# Patient Record
Sex: Male | Born: 1950 | Hispanic: No | Smoking: Current every day smoker
Health system: Southern US, Community
[De-identification: ages and names within clinical notes are randomized; demographics above are authoritative.]

## PROBLEM LIST (undated history)

## (undated) DIAGNOSIS — I10 Essential (primary) hypertension: Secondary | ICD-10-CM

## (undated) HISTORY — DX: Essential (primary) hypertension: I10

---

## 2010-10-25 ENCOUNTER — Encounter: Payer: Self-pay | Admitting: Emergency Medicine

## 2012-04-16 ENCOUNTER — Other Ambulatory Visit: Payer: Self-pay | Admitting: Emergency Medicine

## 2012-04-16 ENCOUNTER — Telehealth: Payer: Self-pay | Admitting: Physician Assistant

## 2012-04-16 NOTE — Telephone Encounter (Signed)
Patient's rx for metoprolol sent in error. Pharmacy contacted and rx cancelled. Patient needs office visit.

## 2012-04-18 ENCOUNTER — Telehealth: Payer: Self-pay

## 2012-04-18 MED ORDER — METOPROLOL SUCCINATE ER 100 MG PO TB24: 100.0000 mg | ORAL_TABLET | Freq: Every day | ORAL | Status: DC

## 2012-04-18 NOTE — Telephone Encounter (Signed)
ADVISED WIFE THAT MED WAS SENT IN AND THAT HE MUST KEEP APPT IN AUG

## 2012-04-18 NOTE — Telephone Encounter (Signed)
I sent in medication but I do not see an appt for pt in August with Dr. Cleta Alberts so please make sure they understand there will be no more refills without an ov.

## 2012-04-18 NOTE — Telephone Encounter (Signed)
Johnny Calhoun STATES HER BP MEDICINE WAS REFILLED, BUT NOT HER HUSBAND, SHE UNDERSTANDS HE HASN'T BEEN HERE IN A WHILE, THEY ARE WITHOUT INSURANCE AND HAVE MADE AN APPT WITH DR DAUB IN [place]. PLEASE CALL [phone]   [facility] AT [phone]

## 2012-05-15 ENCOUNTER — Encounter: Payer: Self-pay | Admitting: Emergency Medicine

## 2012-05-15 ENCOUNTER — Ambulatory Visit: Payer: Self-pay

## 2012-05-15 ENCOUNTER — Ambulatory Visit: Payer: Self-pay | Admitting: Emergency Medicine

## 2012-05-15 VITALS — BP 207/103 | HR 54 | Temp 97.1°F | Resp 16 | Ht 70.0 in | Wt 171.0 lb

## 2012-05-15 DIAGNOSIS — I10 Essential (primary) hypertension: Secondary | ICD-10-CM

## 2012-05-15 LAB — CBC WITH DIFFERENTIAL/PLATELET
Basophils Absolute: 0.1 10*3/uL (ref 0.0–0.1)
Lymphocytes Relative: 21 % (ref 12–46)
Lymphs Abs: 1.6 10*3/uL (ref 0.7–4.0)
MCV: 91.1 fL (ref 78.0–100.0)
Neutro Abs: 4.9 10*3/uL (ref 1.7–7.7)
Neutrophils Relative %: 65 % (ref 43–77)
Platelets: 271 10*3/uL (ref 150–400)
RBC: 5.14 MIL/uL (ref 4.22–5.81)
RDW: 13.8 % (ref 11.5–15.5)
WBC: 7.5 10*3/uL (ref 4.0–10.5)

## 2012-05-15 LAB — COMPREHENSIVE METABOLIC PANEL
Albumin: 4.4 g/dL (ref 3.5–5.2)
CO2: 29 mEq/L (ref 19–32)
Calcium: 9.7 mg/dL (ref 8.4–10.5)
Chloride: 103 mEq/L (ref 96–112)
Glucose, Bld: 89 mg/dL (ref 70–99)
Sodium: 138 mEq/L (ref 135–145)
Total Bilirubin: 1.5 mg/dL — ABNORMAL HIGH (ref 0.3–1.2)
Total Protein: 7 g/dL (ref 6.0–8.3)

## 2012-05-15 LAB — LIPID PANEL: HDL: 47 mg/dL (ref >39)

## 2012-05-15 MED ORDER — METOPROLOL SUCCINATE ER 100 MG PO TB24: 100.0000 mg | ORAL_TABLET | Freq: Every day | ORAL | Status: DC

## 2012-05-15 MED ORDER — AMLODIPINE BESYLATE 10 MG PO TABS: 10.0000 mg | ORAL_TABLET | Freq: Every day | ORAL | Status: DC

## 2012-05-15 MED ORDER — HYDROCHLOROTHIAZIDE 12.5 MG PO TABS: 12.5000 mg | ORAL_TABLET | Freq: Every day | ORAL | Status: DC

## 2012-05-15 NOTE — Progress Notes (Signed)
  Subjective:    Patient ID: Johnny Calhoun, male    DOB: 1951-04-22, 61 y.o.   MRN: [id number]  HPI patient here to recheck his blood pressure . He has been out of his medication now for about 3 months. 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 He continues to smoke 1/2-2 packs per day. He needs to drink 5 glasses of vodka per day he has not been taking his medications for a few months.    Review of Systems     Objective:   Physical Exam HEENT exam is unremarkable. Chest exam reveals diminished breath sounds in the bases. His cardiac exam is unremarkable  [facility] reading (PRIMARY) by  Dr.Daub NAD      Assessment & Plan:   Labs were done. Blood pressure medications were refilled. Patient was advised of his high risk of stroke or heart attack if he doesn't make some major lifestyle changes and be compliant with his medications

## 2012-09-16 ENCOUNTER — Other Ambulatory Visit: Payer: Self-pay | Admitting: Emergency Medicine

## 2012-12-20 ENCOUNTER — Other Ambulatory Visit: Payer: Self-pay | Admitting: *Deleted

## 2012-12-20 MED ORDER — AMLODIPINE BESYLATE 10 MG PO TABS: ORAL_TABLET | ORAL | Status: DC

## 2013-01-21 ENCOUNTER — Other Ambulatory Visit: Payer: Self-pay | Admitting: Physician Assistant

## 2013-02-18 ENCOUNTER — Other Ambulatory Visit: Payer: Self-pay | Admitting: Emergency Medicine

## 2013-05-18 ENCOUNTER — Telehealth: Payer: Self-pay

## 2013-05-18 DIAGNOSIS — I10 Essential (primary) hypertension: Secondary | ICD-10-CM

## 2013-05-18 MED ORDER — METOPROLOL SUCCINATE ER 100 MG PO TB24: 100.0000 mg | ORAL_TABLET | Freq: Every day | ORAL | Status: DC

## 2013-05-18 MED ORDER — AMLODIPINE BESYLATE 10 MG PO TABS: 10.0000 mg | ORAL_TABLET | Freq: Every day | ORAL | Status: DC

## 2013-05-18 NOTE — Telephone Encounter (Signed)
15 day supply sent.  Needs OV, 3rd notice.  No further refills until OV

## 2013-05-18 NOTE — Telephone Encounter (Signed)
Patient called needing refill on; metoprolol succinate (TOPROL-XL) 100 MG 24 hr tablet And on amLODipine (NORVASC) 10 MG tablet  Best number; [phone]  Pharmacy is; [facility] in [place] phone number is: [phone]

## 2013-05-19 NOTE — Telephone Encounter (Signed)
LMOM 15 day supply sent to pharmacy, RTC for additional refills

## 2013-05-20 ENCOUNTER — Telehealth: Payer: Self-pay

## 2013-05-20 DIAGNOSIS — I10 Essential (primary) hypertension: Secondary | ICD-10-CM

## 2013-05-20 MED ORDER — METOPROLOL SUCCINATE ER 100 MG PO TB24: 100.0000 mg | ORAL_TABLET | Freq: Every day | ORAL | Status: DC

## 2013-05-20 MED ORDER — AMLODIPINE BESYLATE 10 MG PO TABS: 10.0000 mg | ORAL_TABLET | Freq: Every day | ORAL | Status: DC

## 2013-05-20 NOTE — Telephone Encounter (Signed)
Wife called and reported that they just p/up RFs of BP meds and only got #15 d/t 3rd notice needing OV. She has called and scheduled appt after first notice but was a couple of months before she could get an appt. appt scheduled for  9/23 and advised wife that I will send in #15 more to cover him until appt.

## 2013-06-25 ENCOUNTER — Ambulatory Visit: Payer: Self-pay | Admitting: Emergency Medicine

## 2013-06-25 ENCOUNTER — Encounter: Payer: Self-pay | Admitting: Emergency Medicine

## 2013-06-25 VITALS — BP 156/100 | HR 58 | Temp 97.9°F | Resp 16 | Ht 69.5 in | Wt 169.4 lb

## 2013-06-25 DIAGNOSIS — I10 Essential (primary) hypertension: Secondary | ICD-10-CM | POA: Insufficient documentation

## 2013-06-25 DIAGNOSIS — N400 Enlarged prostate without lower urinary tract symptoms: Secondary | ICD-10-CM

## 2013-06-25 DIAGNOSIS — F17200 Nicotine dependence, unspecified, uncomplicated: Secondary | ICD-10-CM

## 2013-06-25 LAB — COMPREHENSIVE METABOLIC PANEL
ALT: 38 U/L (ref 0–53)
BUN: 15 mg/dL (ref 6–23)
CO2: 29 mEq/L (ref 19–32)
Calcium: 9.7 mg/dL (ref 8.4–10.5)
Chloride: 102 mEq/L (ref 96–112)
Creat: 0.9 mg/dL (ref 0.50–1.35)
Total Protein: 7.3 g/dL (ref 6.0–8.3)

## 2013-06-25 LAB — CBC WITH DIFFERENTIAL/PLATELET
Eosinophils Relative: 2 % (ref 0–5)
HCT: 47.7 % (ref 39.0–52.0)
Hemoglobin: 16.9 g/dL (ref 13.0–17.0)
Lymphocytes Relative: 29 % (ref 12–46)
Lymphs Abs: 2 10*3/uL (ref 0.7–4.0)
MCV: 94.5 fL (ref 78.0–100.0)
Monocytes Absolute: 0.6 10*3/uL (ref 0.1–1.0)
Neutro Abs: 4 10*3/uL (ref 1.7–7.7)
Platelets: 277 10*3/uL (ref 150–400)
RBC: 5.05 MIL/uL (ref 4.22–5.81)
WBC: 6.8 10*3/uL (ref 4.0–10.5)

## 2013-06-25 MED ORDER — AMLODIPINE BESYLATE 10 MG PO TABS: 10.0000 mg | ORAL_TABLET | Freq: Every day | ORAL | Status: DC

## 2013-06-25 MED ORDER — METOPROLOL SUCCINATE ER 100 MG PO TB24: 100.0000 mg | ORAL_TABLET | Freq: Every day | ORAL | Status: DC

## 2013-06-25 NOTE — Progress Notes (Signed)
  Subjective:    Patient ID: Johnny Calhoun, male    DOB: Feb 11, 1951, 62 y.o.   MRN: [id number]  HPI followup hypertension. He continues to smoke a pack and a half a day. He continues to drink regularly. He denies any chest pain or shortness of breath. He states he has been only taking a half a pill because he's been running out of his medications . He intends to get his flu shot at the pharmacy. He recently has had sinus congestion which is getting better the last couple days.    Review of Systems     Objective:   Physical Exam patient is alert and cooperative. His neck is supple. His chest is clear to auscultation and percussion. Heart regular rate no murmurs. Abdomen is soft nontender. Rectal exam of is a symmetrically enlarged prostate  Results for orders placed in visit on 06/25/13  IFOBT (OCCULT BLOOD)      Result Value Range   IFOBT Negative           Assessment & Plan:  Again advised smoking cessation cutting back on alcohol and getting tested with colon screening. He states he is not able to do this at the present time and understands risk.

## 2013-06-26 LAB — PSA: PSA: 0.63 ng/mL (ref <=4.00)

## 2013-07-23 ENCOUNTER — Other Ambulatory Visit: Payer: Self-pay | Admitting: Emergency Medicine

## 2013-10-09 IMAGING — CR DG CHEST 2V
2 series · 2 of 2 positions shown · non-contrast
Comparison: There are

CLINICAL DATA: Smoker.

CHEST - 2 VIEW

[PA]
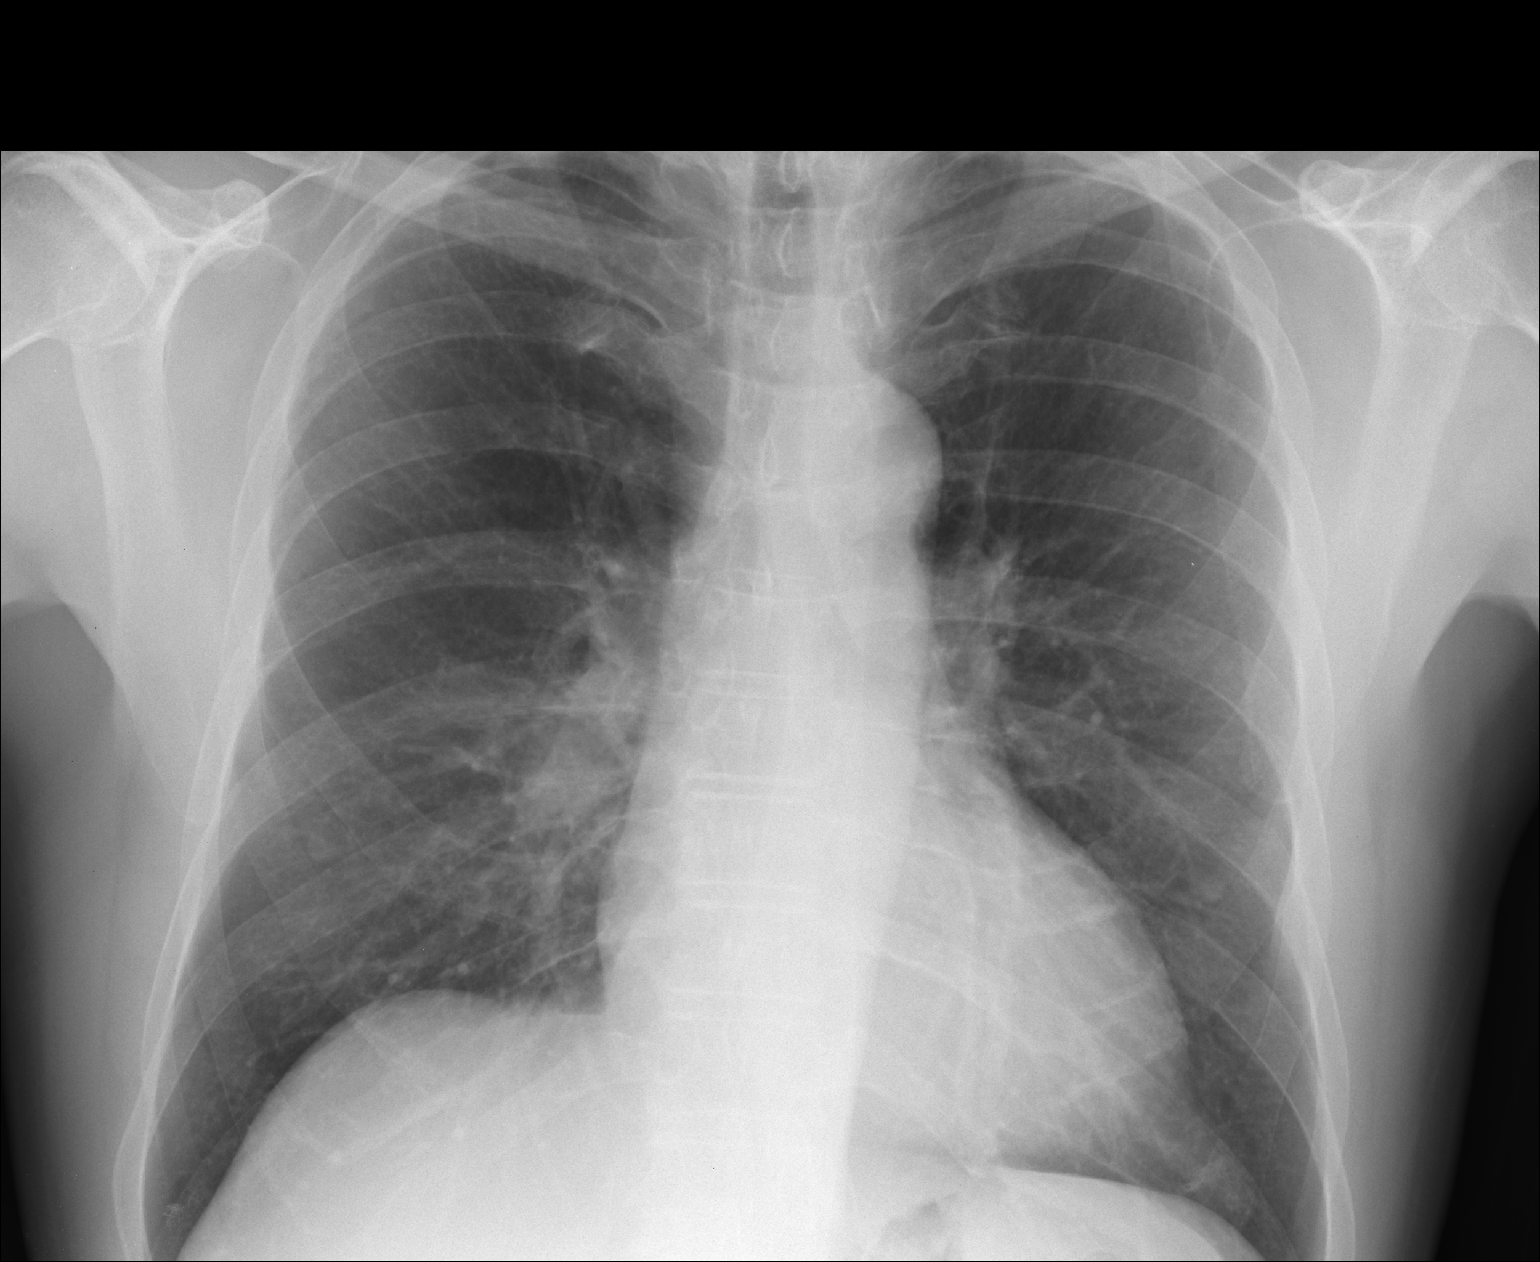

[lateral]
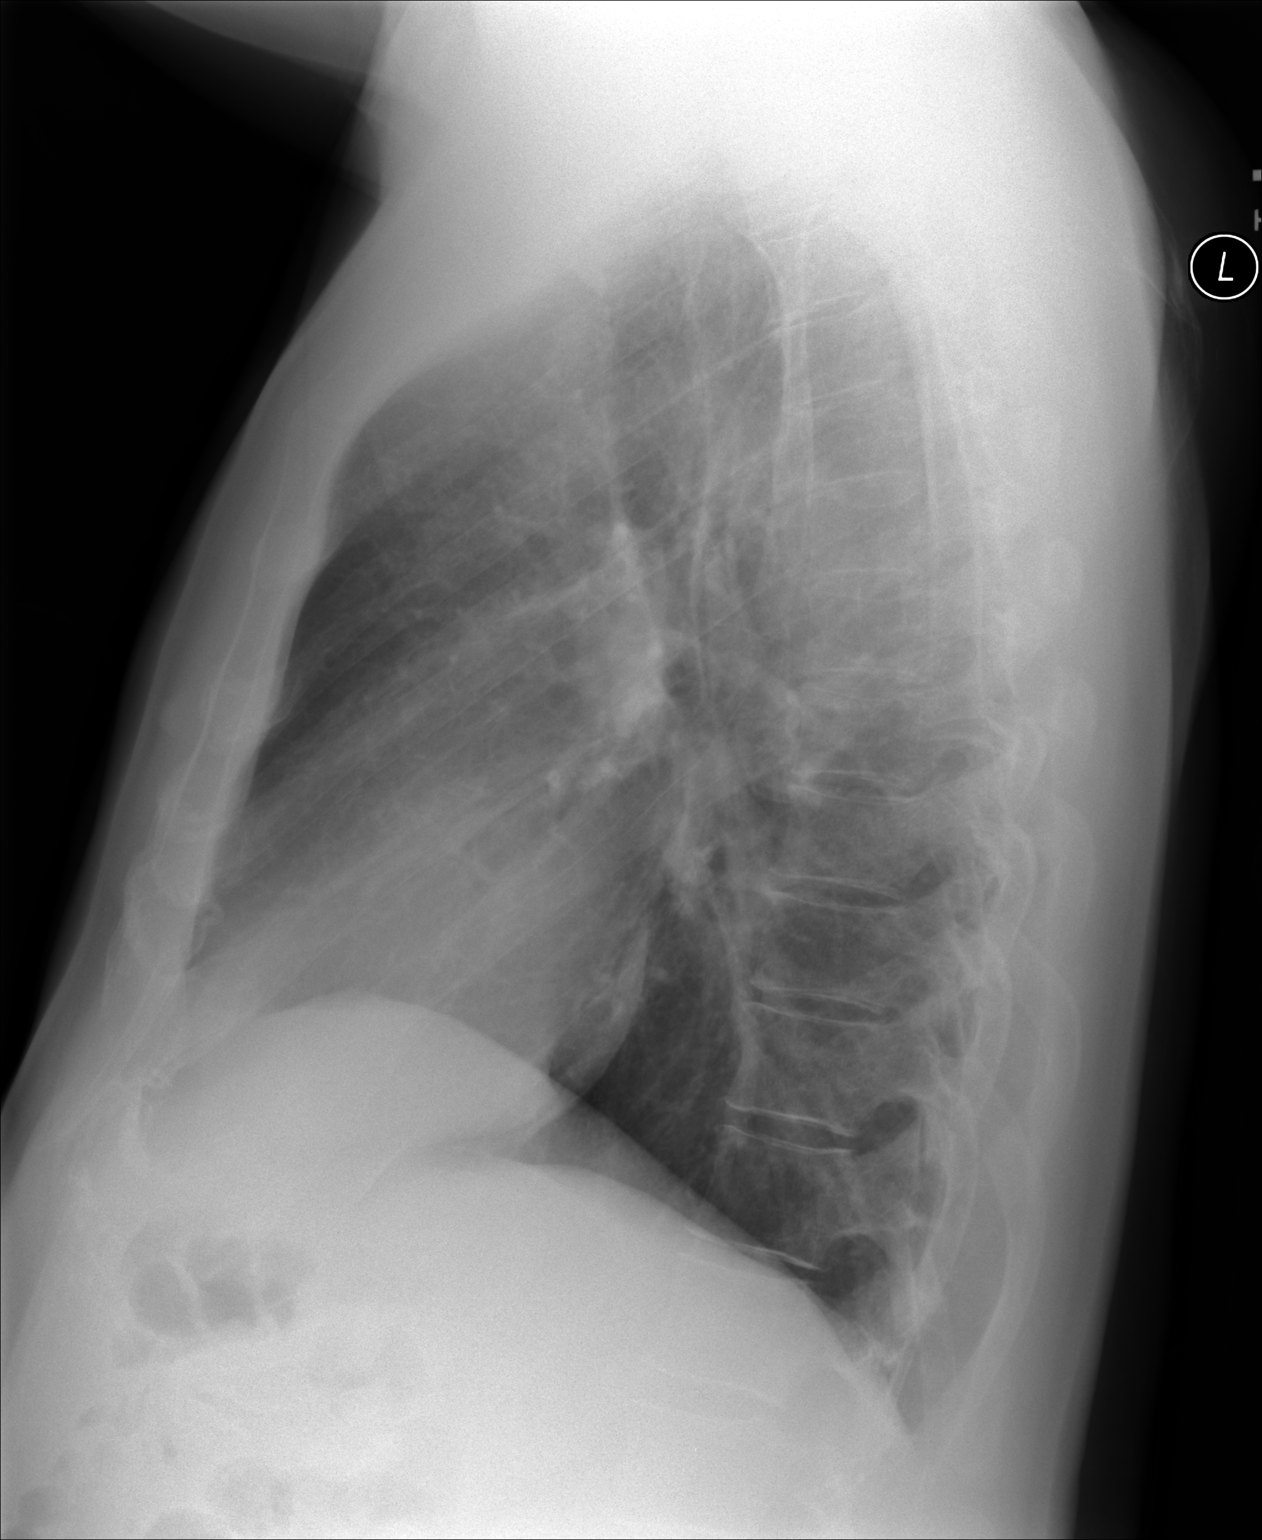

[2 of 2 positions shown; findings below may reference images not displayed]

FINDINGS: Heart size is upper normal.  Thoracic aorta and hilar
contours are within normal limits.  The lungs are well expanded.
Probable bilateral nipple shadows are noted and are symmetric.
There is mild pleuroparenchymal scarring at the lung apices.  No
airspace disease or pleural effusion.  No acute bony abnormality.
IMPRESSION: No acute findings.  Probable bilateral nipple shadows.  If desired,
repeat frontal view of chest radiograph with nipple markers could
be performed.

Clinically significant discrepancy from primary report, if
provided: Yes

## 2013-12-22 ENCOUNTER — Other Ambulatory Visit: Payer: Self-pay | Admitting: Emergency Medicine

## 2014-01-21 ENCOUNTER — Telehealth: Payer: Self-pay

## 2014-01-21 MED ORDER — METOPROLOL SUCCINATE ER 100 MG PO TB24: 100.0000 mg | ORAL_TABLET | Freq: Every day | ORAL | Status: DC

## 2014-01-21 NOTE — Telephone Encounter (Signed)
Patient needs his metoprolol succinate (TOPROL-XL) 100 MG 24 hr tablet Refilled for six months by dr. Cleta Albertsaub   [phone]  [facility] [place] [phone]

## 2014-01-21 NOTE — Telephone Encounter (Signed)
I have sent a 90 day supply.  Pt needs follow up for further refills

## 2014-01-21 NOTE — Telephone Encounter (Signed)
LMVM for patient to CB.   

## 2014-01-22 NOTE — Telephone Encounter (Signed)
LMOM of info 

## 2014-04-21 ENCOUNTER — Telehealth: Payer: Self-pay

## 2014-04-21 NOTE — Telephone Encounter (Signed)
Medication refill on Metoprolol.  He has an appt schedule on 05/06/2014 with Dr. Cleta Albertsaub and will need his medication refilled before the appt. Pt # [phone]

## 2014-04-25 MED ORDER — METOPROLOL SUCCINATE ER 100 MG PO TB24: 100.0000 mg | ORAL_TABLET | Freq: Every day | ORAL | Status: DC

## 2014-04-25 NOTE — Telephone Encounter (Signed)
Sent in and notified pt's wife.

## 2014-05-06 ENCOUNTER — Ambulatory Visit: Payer: Self-pay | Admitting: Emergency Medicine

## 2014-05-06 DIAGNOSIS — F10129 Alcohol abuse with intoxication, unspecified: Secondary | ICD-10-CM | POA: Insufficient documentation

## 2014-05-25 ENCOUNTER — Other Ambulatory Visit: Payer: Self-pay | Admitting: Emergency Medicine

## 2014-07-15 ENCOUNTER — Ambulatory Visit: Payer: Self-pay | Admitting: Emergency Medicine

## 2015-02-11 ENCOUNTER — Ambulatory Visit (INDEPENDENT_AMBULATORY_CARE_PROVIDER_SITE_OTHER): Payer: Self-pay | Admitting: Emergency Medicine

## 2015-02-11 VITALS — BP 154/100 | HR 69 | Temp 98.0°F | Resp 17 | Ht 69.0 in | Wt 168.0 lb

## 2015-02-11 DIAGNOSIS — Z789 Other specified health status: Secondary | ICD-10-CM

## 2015-02-11 DIAGNOSIS — F17210 Nicotine dependence, cigarettes, uncomplicated: Secondary | ICD-10-CM

## 2015-02-11 DIAGNOSIS — F1010 Alcohol abuse, uncomplicated: Secondary | ICD-10-CM

## 2015-02-11 DIAGNOSIS — I10 Essential (primary) hypertension: Secondary | ICD-10-CM

## 2015-02-11 DIAGNOSIS — F17200 Nicotine dependence, unspecified, uncomplicated: Secondary | ICD-10-CM

## 2015-02-11 LAB — POCT CBC
Granulocyte percent: 67.8 %G (ref 37–80)
HCT, POC: 48.5 % (ref 43.5–53.7)
HEMOGLOBIN: 16.4 g/dL (ref 14.1–18.1)
Lymph, poc: 1.8 (ref 0.6–3.4)
MCH, POC: 32.4 pg — AB (ref 27–31.2)
MCHC: 33.7 g/dL (ref 31.8–35.4)
MCV: 96 fL (ref 80–97)
MID (cbc): 0.5 (ref 0–0.9)
MPV: 7.1 fL (ref 0–99.8)
POC Granulocyte: 4.7 (ref 2–6.9)
POC LYMPH PERCENT: 25.4 %L (ref 10–50)
POC MID %: 6.8 %M (ref 0–12)
Platelet Count, POC: 257 10*3/uL (ref 142–424)
RBC: 5.06 M/uL (ref 4.69–6.13)
RDW, POC: 13.8 %
WBC: 6.9 10*3/uL (ref 4.6–10.2)

## 2015-02-11 LAB — BASIC METABOLIC PANEL
BUN: 16 mg/dL (ref 6–23)
CO2: 26 mEq/L (ref 19–32)
Calcium: 9.8 mg/dL (ref 8.4–10.5)
Chloride: 103 mEq/L (ref 96–112)
Creat: 0.86 mg/dL (ref 0.50–1.35)
GLUCOSE: 90 mg/dL (ref 70–99)
Potassium: 4.3 mEq/L (ref 3.5–5.3)
Sodium: 140 mEq/L (ref 135–145)

## 2015-02-11 MED ORDER — AMLODIPINE BESYLATE 10 MG PO TABS: ORAL_TABLET | ORAL | Status: DC

## 2015-02-11 MED ORDER — METOPROLOL SUCCINATE ER 100 MG PO TB24: ORAL_TABLET | ORAL | Status: DC

## 2015-02-11 MED ORDER — LISINOPRIL 20 MG PO TABS: 20.0000 mg | ORAL_TABLET | Freq: Every day | ORAL | Status: DC

## 2015-02-11 NOTE — Patient Instructions (Signed)
How Much is Too Much Alcohol? Drinking too much alcohol can cause injury, accidents, and health problems. These types of problems can include:   Car crashes.  Falls.  Family fighting (domestic violence).  Drowning.  Fights.  Injuries.  Burns.  Damage to certain organs.  Having a baby with birth defects. ONE DRINK CAN BE TOO MUCH WHEN YOU ARE:  Working.  Pregnant or breastfeeding.  Taking medicines. Ask your doctor.  Driving or planning to drive. WHAT IS A STANDARD DRINK?   1 regular beer (12 ounces or 360 milliliters).  1 glass of wine (5 ounces or 150 milliliters).  1 shot of liquor (1.5 ounces or 45 milliliters). BLOOD ALCOHOL LEVELS   .00 A person is sober.  .03 A person has no trouble keeping balance, talking, or seeing right, but a "buzz" may be felt.  .05 A person feels "buzzed" and relaxed.  .08 or .10  A person is drunk. He or she has trouble talking, seeing right, and keeping his or her balance.  .15 A person loses body control and may pass out (blackout).  .20 A person has trouble walking (staggering) and throws up (vomits).  .30 A person will pass out (unconscious).  .40+ A person will be in a coma. Death is possible. If you or someone you know has a drinking problem, get help from a doctor.  Document Released: 07/16/2009 Document Revised: 12/12/2011 Document Reviewed: 07/16/2009 ExitCare Patient Information 2015 [employer]. This information is not intended to replace advice given to you by your health care provider. Make sure you discuss any questions you have with your health care provider. Smoking Cessation Quitting smoking is important to your health and has many advantages. However, it is not always easy to quit since nicotine is a very addictive drug. Oftentimes, people try 3 times or more before being able to quit. This document explains the best ways for you to prepare to quit smoking. Quitting takes hard work and a lot of effort, but  you can do it. ADVANTAGES OF QUITTING SMOKING  You will live longer, feel better, and live better.  Your body will feel the impact of quitting smoking almost immediately.  Within 20 minutes, blood pressure decreases. Your pulse returns to its normal level.  After 8 hours, carbon monoxide levels in the blood return to normal. Your oxygen level increases.  After 24 hours, the chance of having a heart attack starts to decrease. Your breath, hair, and body stop smelling like smoke.  After 48 hours, damaged nerve endings begin to recover. Your sense of taste and smell improve.  After 72 hours, the body is virtually free of nicotine. Your bronchial tubes relax and breathing becomes easier.  After 2 to 12 weeks, lungs can hold more air. Exercise becomes easier and circulation improves.  The risk of having a heart attack, stroke, cancer, or lung disease is greatly reduced.  After 1 year, the risk of coronary heart disease is cut in half.  After 5 years, the risk of stroke falls to the same as a nonsmoker.  After 10 years, the risk of lung cancer is cut in half and the risk of other cancers decreases significantly.  After 15 years, the risk of coronary heart disease drops, usually to the level of a nonsmoker.  If you are pregnant, quitting smoking will improve your chances of having a healthy baby.  The people you live with, especially any children, will be healthier.  You will have extra money to   spend on things other than cigarettes. QUESTIONS TO THINK ABOUT BEFORE ATTEMPTING TO QUIT You may want to talk about your answers with your health care provider.  Why do you want to quit?  If you tried to quit in the past, what helped and what did not?  What will be the most difficult situations for you after you quit? How will you plan to handle them?  Who can help you through the tough times? Your family? Friends? A health care provider?  What pleasures do you get from smoking? What  ways can you still get pleasure if you quit? Here are some questions to ask your health care provider:  How can you help me to be successful at quitting?  What medicine do you think would be best for me and how should I take it?  What should I do if I need more help?  What is smoking withdrawal like? How can I get information on withdrawal? GET READY  Set a quit date.  Change your environment by getting rid of all cigarettes, ashtrays, matches, and lighters in your home, car, or work. Do not let people smoke in your home.  Review your past attempts to quit. Think about what worked and what did not. GET SUPPORT AND ENCOURAGEMENT You have a better chance of being successful if you have help. You can get support in many ways.  Tell your family, friends, and coworkers that you are going to quit and need their support. Ask them not to smoke around you.  Get individual, group, or telephone counseling and support. Programs are available at local hospitals and health centers. Call your local health department for information about programs in your area.  Spiritual beliefs and practices may help some smokers quit.  Download a "quit meter" on your computer to keep track of quit statistics, such as how long you have gone without smoking, cigarettes not smoked, and money saved.  Get a self-help book about quitting smoking and staying off tobacco. LEARN NEW SKILLS AND BEHAVIORS  Distract yourself from urges to smoke. Talk to someone, go for a walk, or occupy your time with a task.  Change your normal routine. Take a different route to work. Drink tea instead of coffee. Eat breakfast in a different place.  Reduce your stress. Take a hot bath, exercise, or read a book.  Plan something enjoyable to do every day. Reward yourself for not smoking.  Explore interactive web-based programs that specialize in helping you quit. GET MEDICINE AND USE IT CORRECTLY Medicines can help you stop smoking  and decrease the urge to smoke. Combining medicine with the above behavioral methods and support can greatly increase your chances of successfully quitting smoking.  Nicotine replacement therapy helps deliver nicotine to your body without the negative effects and risks of smoking. Nicotine replacement therapy includes nicotine gum, lozenges, inhalers, nasal sprays, and skin patches. Some may be available over-the-counter and others require a prescription.  Antidepressant medicine helps people abstain from smoking, but how this works is unknown. This medicine is available by prescription.  Nicotinic receptor partial agonist medicine simulates the effect of nicotine in your brain. This medicine is available by prescription. Ask your health care provider for advice about which medicines to use and how to use them based on your health history. Your health care provider will tell you what side effects to look out for if you choose to be on a medicine or therapy. Carefully read the information on the package. Do   not use any other product containing nicotine while using a nicotine replacement product.  RELAPSE OR DIFFICULT SITUATIONS Most relapses occur within the first 3 months after quitting. Do not be discouraged if you start smoking again. Remember, most people try several times before finally quitting. You may have symptoms of withdrawal because your body is used to nicotine. You may crave cigarettes, be irritable, feel very hungry, cough often, get headaches, or have difficulty concentrating. The withdrawal symptoms are only temporary. They are strongest when you first quit, but they will go away within 10-14 days. To reduce the chances of relapse, try to:  Avoid drinking alcohol. Drinking lowers your chances of successfully quitting.  Reduce the amount of caffeine you consume. Once you quit smoking, the amount of caffeine in your body increases and can give you symptoms, such as a rapid heartbeat,  sweating, and anxiety.  Avoid smokers because they can make you want to smoke.  Do not let weight gain distract you. Many smokers will gain weight when they quit, usually less than 10 pounds. Eat a healthy diet and stay active. You can always lose the weight gained after you quit.  Find ways to improve your mood other than smoking. FOR MORE INFORMATION  www.smokefree.gov  Document Released: 09/13/2001 Document Revised: 02/03/2014 Document Reviewed: 12/29/2011 ExitCare Patient Information 2015 [employer]. This information is not intended to replace advice given to you by your health care provider. Make sure you discuss any questions you have with your health care provider.  

## 2015-02-11 NOTE — Progress Notes (Addendum)
Subjective:  This chart was scribed for Collene GobbleSteven A Laurrie Toppin, MD by Charline BillsEssence Howell, ED Scribe. The patient was seen in room 9. Patient's care was started at 8:30 AM.   Patient ID: Johnny Calhoun, male    DOB: Aug 20, 1951, 64 y.o.   MRN: [id number]  Chief Complaint  Patient presents with  . Hypertension   HPI HPI Comments: Johnny Calhoun is a 64 y.o. male, with a h/o HTN, who presents to the [facility] for a medication refill of Lisinopril 20, Metoprolol 100, and Amlodipine 10. Pt reports BP readings of 160/106 on 02/06/15 and 144/87 on 02/09/15. He denies chest pain, SOB, abdominal pain. Pt was recently advised to apply for ObamaCare. He has not had a colonoscopy done yet; he is waiting to be approved for Medicare. He reports that he is not able to afford a CXR at this time but agrees to blood work. Pt states that he cut back on alcohol consumption to a few shots in the evenings and has cut back to 1 ppd.  Pt's 64 y.o grand daughter lives with him and his wife.   Past Medical History  Diagnosis Date  . Hypertension    Current Outpatient Prescriptions on File Prior to Visit  Medication Sig Dispense Refill  . amLODipine (NORVASC) 10 MG tablet Take 1 tablet (10 mg total) by mouth daily. NEED VISIT/LABS!!! 3rd notice!! 30 tablet 11  . metoprolol succinate (TOPROL-XL) 100 MG 24 hr tablet Take 1 tablet (100 mg total) by mouth daily. PATIENT NEEDS OFFICE VISIT FOR ADDITIONAL REFILLS 30 tablet 0   No current facility-administered medications on file prior to visit.   Allergies  Allergen Reactions  . Penicillins     Review of Systems  Constitutional: Negative for fever.  Respiratory: Negative for shortness of breath.   Cardiovascular: Negative for chest pain.  Gastrointestinal: Negative for abdominal pain.  BP 154/100 mmHg  Pulse 69  Temp(Src) 98 F (36.7 C) (Oral)  Resp 17  Ht 5\' 9"  (1.753 m)  Wt 168 lb (76.204 kg)  BMI 24.80 kg/m2  SpO2 97%    Objective:   Physical  Exam  Constitutional: He is oriented to person, place, and time. He appears well-developed and well-nourished. No distress.  Odor of stale alcohol.   HENT:  Head: Normocephalic and atraumatic.  Eyes: Conjunctivae and EOM are normal.  Neck: Neck supple. No tracheal deviation present.  Cardiovascular: Normal rate, regular rhythm and normal heart sounds.   BP on R: 180/86, BP on L: 150/90, Repeat BP on R:170/90  Pulmonary/Chest: Effort normal and breath sounds normal. No respiratory distress.  Musculoskeletal: Normal range of motion. He exhibits no edema.  Neurological: He is alert and oriented to person, place, and time.  Skin: Skin is warm and dry.  Psychiatric: He has a normal mood and affect. His behavior is normal.  Nursing note and vitals reviewed.  Results for orders placed or performed in visit on 02/11/15  POCT CBC  Result Value Ref Range   WBC 6.9 4.6 - 10.2 K/uL   Lymph, poc 1.8 0.6 - 3.4   POC LYMPH PERCENT 25.4 10 - 50 %L   MID (cbc) 0.5 0 - 0.9   POC MID % 6.8 0 - 12 %M   POC Granulocyte 4.7 2 - 6.9   Granulocyte percent 67.8 37 - 80 %G   RBC 5.06 4.69 - 6.13 M/uL   Hemoglobin 16.4 14.1 - 18.1 g/dL   HCT, POC 48.5  43.5 - 53.7 %   MCV 96.0 80 - 97 fL   MCH, POC 32.4 (A) 27 - 31.2 pg   MCHC 33.7 31.8 - 35.4 g/dL   RDW, POC 16.113.8 %   Platelet Count, POC 257 142 - 424 K/uL   MPV 7.1 0 - 99.8 fL      Assessment & Plan:  I advised patient to start on a baby aspirin a day. I told him as soon as his  Obama care gets approved I would like to see him so we can do more of his preventive healthcare. He was again advised to cut back on his alcohol. He was again advised to decrease his smoking. He needs to make some significant lifestyle changes.I personally performed the services described in this documentation, which was scribed in my presence. The recorded information has been reviewed and is accurate. He ran out of his medications.  Earl LitesSteve Quaron Delacruz, MD

## 2015-10-18 ENCOUNTER — Telehealth: Payer: Self-pay | Admitting: Family Medicine

## 2015-10-18 NOTE — Telephone Encounter (Signed)
His phone was off so i lmom of wife phone for them to call and rescheduled their appt with daub

## 2016-02-18 ENCOUNTER — Other Ambulatory Visit: Payer: Self-pay | Admitting: Emergency Medicine

## 2016-03-08 ENCOUNTER — Encounter: Payer: Self-pay | Admitting: Emergency Medicine

## 2016-04-14 ENCOUNTER — Encounter: Payer: Self-pay | Admitting: Emergency Medicine

## 2016-04-19 ENCOUNTER — Other Ambulatory Visit: Payer: Self-pay | Admitting: Emergency Medicine

## 2016-04-29 ENCOUNTER — Other Ambulatory Visit: Payer: Self-pay | Admitting: Emergency Medicine

## 2021-07-03 DEATH — deceased
